# Patient Record
Sex: Male | Born: 1960 | Race: White | Hispanic: No | Marital: Single | Smoking: Current every day smoker
Health system: Southern US, Community
[De-identification: ages and names within clinical notes are randomized; demographics above are authoritative.]

## PROBLEM LIST (undated history)

## (undated) HISTORY — PX: HAND SURGERY: SHX662

---

## 2009-01-17 ENCOUNTER — Emergency Department (HOSPITAL_COMMUNITY): Admission: EM | Admit: 2009-01-17 | Discharge: 2009-01-17 | Payer: Self-pay | Admitting: Emergency Medicine

## 2011-09-04 ENCOUNTER — Emergency Department (HOSPITAL_COMMUNITY): Payer: Self-pay

## 2011-09-04 ENCOUNTER — Emergency Department (HOSPITAL_COMMUNITY)
Admission: EM | Admit: 2011-09-04 | Discharge: 2011-09-04 | Disposition: A | Discharge status: Home / Self Care | Payer: Self-pay | Attending: Emergency Medicine | Admitting: Emergency Medicine

## 2011-09-04 DIAGNOSIS — Z203 Contact with and (suspected) exposure to rabies: Secondary | ICD-10-CM | POA: Insufficient documentation

## 2011-09-04 DIAGNOSIS — W540XXA Bitten by dog, initial encounter: Secondary | ICD-10-CM | POA: Insufficient documentation

## 2011-09-04 DIAGNOSIS — S91009A Unspecified open wound, unspecified ankle, initial encounter: Secondary | ICD-10-CM | POA: Insufficient documentation

## 2011-09-04 DIAGNOSIS — Z23 Encounter for immunization: Secondary | ICD-10-CM | POA: Insufficient documentation

## 2011-09-04 DIAGNOSIS — S81009A Unspecified open wound, unspecified knee, initial encounter: Secondary | ICD-10-CM | POA: Insufficient documentation

## 2011-09-14 NOTE — Consult Note (Signed)
NAMED[place]TAE, ROUTE NO.:  [id number]  MEDICAL RECORD NO.:  [id number]  LOCATION:  WLED                         FACILITY:  [facility]  PHYSICIAN:  Jones Broom, MD    DATE OF BIRTH:  Jul 10, 1961  DATE OF CONSULTATION:  09/04/2011 DATE OF DISCHARGE:                                CONSULTATION   REASON FOR VISIT:  Orthopedic consultation regarding right knee laceration.  HISTORY OF PRESENT ILLNESS:  Mr. Hingle is a 50 year old, otherwise healthy gentleman who was bit by an unknown pit bull at 11 p.m. last evening on the right leg.  He came to the emergency department at the urging of his friends.  He was evaluated by the ED physician who was concerned about possible intraarticular involvement given the location of the wound and requested that I evaluate for an intraarticular knee involvement.  The patient denies any other injuries except for the two lacerations; one on the anterior right knee and one on the lateral right distal thigh.  Pain is minimal at this point.  PAST MEDICAL HISTORY:  None.  PAST SURGICAL HISTORY:  Prior knee surgery.  MEDICATIONS:  None.  DRUG ALLERGIES:  CODEINE.  FAMILY HISTORY:  Noncontributory.  SOCIAL HISTORY:  Endorses tobacco and alcohol use on a regular basis.  REVIEW OF SYSTEMS:  As above, otherwise negative.  PHYSICAL EXAM:  GENERAL:  Healthy, well-developed, well-nourished male in no acute distress, awake, alert and oriented x3. HEENT:  Extraocular motions intact.  No use of accessory or respiratory muscles for breathing.  No skin rash. EXTREMITIES:  Examination of the right knee demonstrates two isolated lacerations; one 3-cm transverse just inferior to the inferior pole of the patella.  The underlying retinaculum is exposed and it appears completely intact.  I did palpate this and by palpation, it was intact. There is no joint fluid apparent in the wound.  The edges of the wound are benign.  The distal and lateral  thigh wound measures approximately 4 cm longitudinal and has some underlying exposed muscle, but it is not in the region of the joint capsule. NEUROLOGICAL:  Distally, he is neurovascularly intact.  There is no pain with knee range of motion.  No knee effusion.  DIAGNOSTIC STUDIES:  Three views of the right knee reviewed in the emergency department demonstrate no evidence of fracture or subluxation.  PROCEDURE:  Under sterile conditions, I first infiltrated the lateral suprapatellar region with 3 cc 1% lidocaine with epinephrine and subsequently injected the knee with 120 cc of sterile normal saline through superolateral position with an 18-gauge needle.  This caused large knee effusion, but no evidence for any extravasation of the fluid through either wound with careful exploration.  The fluid was then removed from the knee and the knee was covered, small sterile dressing was applied.  IMPRESSION AND PLAN:  By inspection, palpation and injection, this does not appear to go into the knee joint and I think he can confidently be discharged home today without formal I and D.  He should have irrigation in the emergency department, loose closure, or no closure of the wounds with wound care, oral antibiotics and close followup in the next week or so.  He can follow up with me if desired.  My office number is [phone]. He can be weightbearing as tolerated.     Jones Broom, MD     JC/MEDQ  D:  09/04/2011  T:  09/04/2011  Job:  [id number]  Electronically Signed by Jones Broom  on 09/14/2011 11:34:26 PM

## 2012-05-03 ENCOUNTER — Encounter (HOSPITAL_COMMUNITY): Payer: Self-pay | Admitting: Emergency Medicine

## 2012-05-03 ENCOUNTER — Emergency Department (HOSPITAL_COMMUNITY)
Admission: EM | Admit: 2012-05-03 | Discharge: 2012-05-03 | Disposition: A | Discharge status: Home / Self Care | Payer: Self-pay | Attending: Emergency Medicine | Admitting: Emergency Medicine

## 2012-05-03 DIAGNOSIS — M62838 Other muscle spasm: Secondary | ICD-10-CM | POA: Insufficient documentation

## 2012-05-03 DIAGNOSIS — F17200 Nicotine dependence, unspecified, uncomplicated: Secondary | ICD-10-CM | POA: Insufficient documentation

## 2012-05-03 DIAGNOSIS — M545 Low back pain, unspecified: Secondary | ICD-10-CM | POA: Insufficient documentation

## 2012-05-03 DIAGNOSIS — M549 Dorsalgia, unspecified: Secondary | ICD-10-CM

## 2012-05-03 DIAGNOSIS — M79609 Pain in unspecified limb: Secondary | ICD-10-CM | POA: Insufficient documentation

## 2012-05-03 MED ORDER — KETOROLAC TROMETHAMINE 30 MG/ML IJ SOLN
30.0000 mg | Freq: Once | INTRAMUSCULAR | Status: AC
  Administered 2012-05-03: 30 mg via INTRAMUSCULAR
  Filled 2012-05-03: qty 1

## 2012-05-03 MED ORDER — HYDROCODONE-ACETAMINOPHEN 5-325 MG PO TABS
1.0000 | ORAL_TABLET | Freq: Once | ORAL | Status: AC
  Administered 2012-05-03: 1 via ORAL
  Filled 2012-05-03: qty 1

## 2012-05-03 MED ORDER — HYDROCODONE-ACETAMINOPHEN 5-500 MG PO TABS: 1.0000 | ORAL_TABLET | Freq: Four times a day (QID) | ORAL | Status: AC | PRN

## 2012-05-03 MED ORDER — IBUPROFEN 800 MG PO TABS: 800.0000 mg | ORAL_TABLET | Freq: Three times a day (TID) | ORAL | Status: AC

## 2012-05-03 MED ORDER — DIAZEPAM 5 MG PO TABS: ORAL_TABLET | ORAL | Status: AC

## 2012-05-03 MED ORDER — DIAZEPAM 5 MG PO TABS
10.0000 mg | ORAL_TABLET | Freq: Once | ORAL | Status: AC
  Administered 2012-05-03: 10 mg via ORAL
  Filled 2012-05-03 (×2): qty 1

## 2012-05-03 NOTE — ED Provider Notes (Signed)
History     CSN: [id number]  Arrival date & time 05/03/12  1744   First MD Initiated Contact with Patient 05/03/12 1747      Chief Complaint  Patient presents with  . Back Pain    (Consider location/radiation/quality/duration/timing/severity/associated sxs/prior treatment) HPI  Patient who states he has no known medical problems takes no medicines on a regular basis but states he has a history of "tweaking my back" presents to emergency department complaining of two-day history of right lower back pain with radiation of pain into his right lower extremity. Patient states that pain is similar to pain in the past when his back will "catch and tweak." Patient states that he twisted 2 days ago to turn and had sudden onset of pain in his right lower back. Patient states that over the last 2 days pain will come and go with radiation of pain into his right leg and foot. Patient states pain is aggravated by movement improved with rest. Patient states pain is similar to pain in the past. Patient denies any lower extremity numbness/tingling/weakness, loss of bowel or bladder function, saddle seat paresthesias, or difficulty ambulating however he states that ambulation is aggravated pain in lower back. He denies any abdominal pain or nausea/vomiting/diarrhea, dysuria or hematuria. Patient has taken nothing for pain prior to arrival.  No past medical history on file.  No past surgical history on file.  No family history on file.  History  Substance Use Topics  . Smoking status: Current Everyday Smoker -- 1.0 packs/day    Types: Cigarettes  . Smokeless tobacco: Not on file  . Alcohol Use: Yes     drinks Vodka 1 out of 3 days      Review of Systems  All other systems reviewed and are negative.    Allergies  Codeine  Home Medications   Current Outpatient Rx  Name Route Sig Dispense Refill  . CALCIUM CARBONATE ANTACID 500 MG PO CHEW Oral Chew 1 tablet by mouth daily.      BP 105/78   Pulse 109  Temp(Src) 98.1 F (36.7 C) (Oral)  Resp 16  SpO2 93%  Physical Exam  Nursing note and vitals reviewed. Constitutional: He is oriented to person, place, and time. He appears well-developed and well-nourished. No distress.  HENT:  Head: Normocephalic and atraumatic.  Eyes: Conjunctivae are normal.  Neck: Normal range of motion. Neck supple.  Cardiovascular: Normal rate, regular rhythm, normal heart sounds and intact distal pulses.  Exam reveals no gallop and no friction rub.   No murmur heard. Pulmonary/Chest: Effort normal and breath sounds normal. No respiratory distress. He has no wheezes. He has no rales. He exhibits no tenderness.  Abdominal: Soft. Bowel sounds are normal. He exhibits no distension and no mass. There is no tenderness. There is no rebound and no guarding.  Musculoskeletal: Normal range of motion. He exhibits tenderness. He exhibits no edema.       Tenderness to palpation of right lower lumbar paraspinal region with muscle spasticity but no crepitus, induration, or skin changes. 5 out of 5 strength of bilateral lower extremities no pain in right lower back with full range of motion of right lower [place]. Normal reflexes bilaterally normal sensation bilaterally  Neurological: He is alert and oriented to person, place, and time. He has normal reflexes.  Skin: Skin is warm and dry. No rash noted. He is not diaphoretic. No erythema.  Psychiatric: He has a normal mood and affect.    ED Course  Procedures (including critical care time)  IM toradol, PO valium and PO Norco  Labs Reviewed - No data to display No results found.   1. Back pain   2. Muscle spasm       MDM  No red flags or back pain with no signs or symptoms of central cord compression or cauda equina. Abdomen soft nontender. Patient has bilateral extremities are neurovascularly intact. With 5 out of 5 strength. Patient has muscle spasm of lower back. Spoke at length with patient about  changing or worsening symptoms that should prompt immediate return to emergency department otherwise following up with primary care provider and/or an orthopedic specialist for ongoing back pain. Patient voices understanding and is agreeable plan.        Jenness Corner, [place] 05/03/12 2025

## 2012-05-03 NOTE — Discharge Instructions (Signed)
Take ibuprofen as directed for inflammation and pain with hydrocodone-acetaminophen for breakthrough pain and valium for muscle relaxation but do not drive or operate machinery with hydrocodone or valium use. Ice to areas of soreness for the next few days and then may move to heat. Expect to be sore for the next few days and follow up with primary care physician for recheck of ongoing symptoms but return to ER for emergent changing or worsening of symptoms.    Back Pain, Adult Low back pain is very common. About 1 in 5 people have back pain.The cause of low back pain is rarely dangerous. The pain often gets better over time.About half of people with a sudden onset of back pain feel better in just 2 weeks. About 8 in 10 people feel better by 6 weeks.  CAUSES Some common causes of back pain include:  Strain of the muscles or ligaments supporting the spine.   Wear and tear (degeneration) of the spinal discs.   Arthritis.   Direct injury to the back.  DIAGNOSIS Most of the time, the direct cause of low back pain is not known.However, back pain can be treated effectively even when the exact cause of the pain is unknown.Answering your caregiver's questions about your overall health and symptoms is one of the most accurate ways to make sure the cause of your pain is not dangerous. If your caregiver needs more information, he or she may order lab work or imaging tests (X-rays or MRIs).However, even if imaging tests show changes in your back, this usually does not require surgery. HOME CARE INSTRUCTIONS For many people, back pain returns.Since low back pain is rarely dangerous, it is often a condition that people can learn to [facility] their own.   Remain active. It is stressful on the back to sit or stand in one place. Do not sit, drive, or stand in one place for more than 30 minutes at a time. Take short walks on level surfaces as soon as pain allows.Try to increase the length of time you walk  each day.   Do not stay in bed.Resting more than 1 or 2 days can delay your recovery.   Do not avoid exercise or work.Your body is made to move.It is not dangerous to be active, even though your back may hurt.Your back will likely heal faster if you return to being active before your pain is gone.   Pay attention to your body when you bend and lift. Many people have less discomfortwhen lifting if they bend their knees, keep the load close to their bodies,and avoid twisting. Often, the most comfortable positions are those that put less stress on your recovering back.   Find a comfortable position to sleep. Use a firm mattress and lie on your side with your knees slightly bent. If you lie on your back, put a pillow under your knees.   Only take over-the-counter or prescription medicines as directed by your caregiver. Over-the-counter medicines to reduce pain and inflammation are often the most helpful.Your caregiver may prescribe muscle relaxant drugs.These medicines help dull your pain so you can more quickly return to your normal activities and healthy exercise.   Put ice on the injured area.   Put ice in a plastic bag.   Place a towel between your skin and the bag.   Leave the ice on for 15 to 20 minutes, 3 to 4 times a day for the first 2 to 3 days. After that, ice and heat may  be alternated to reduce pain and spasms.   Ask your caregiver about trying back exercises and gentle massage. This may be of some benefit.   Avoid feeling anxious or stressed.Stress increases muscle tension and can worsen back pain.It is important to recognize when you are anxious or stressed and learn ways to manage it.Exercise is a great option.  SEEK MEDICAL CARE IF:  You have pain that is not relieved with rest or medicine.   You have pain that does not improve in 1 week.   You have new symptoms.   You are generally not feeling well.  SEEK IMMEDIATE MEDICAL CARE IF:   You have pain that  radiates from your back into your legs.   You develop new bowel or bladder control problems.   You have unusual weakness or numbness in your arms or legs.   You develop nausea or vomiting.   You develop abdominal pain.   You feel faint.  Document Released: 12/13/2005 Document Revised: 12/02/2011 Document Reviewed: 05/03/2011 [facility] Patient Information 2012 [place], [place].

## 2012-05-03 NOTE — ED Provider Notes (Signed)
Medical screening examination/treatment/procedure(s) were performed by non-physician practitioner and as supervising physician I was immediately available for consultation/collaboration.   Donel Osowski M Lawren Sexson, MD 05/03/12 2333 

## 2012-05-03 NOTE — ED Notes (Signed)
Reviewed Rx for Vicodin, Valium and Ibuprofen

## 2012-05-03 NOTE — ED Notes (Signed)
Turned and had sudden back pain. Started in right foot and leg last pm, now in back

## 2012-06-16 ENCOUNTER — Encounter (HOSPITAL_COMMUNITY): Payer: Self-pay | Admitting: Emergency Medicine

## 2012-06-16 ENCOUNTER — Emergency Department (HOSPITAL_COMMUNITY)
Admission: EM | Admit: 2012-06-16 | Discharge: 2012-06-16 | Disposition: A | Discharge status: Home / Self Care | Payer: Self-pay | Attending: Emergency Medicine | Admitting: Emergency Medicine

## 2012-06-16 DIAGNOSIS — M545 Low back pain, unspecified: Secondary | ICD-10-CM | POA: Insufficient documentation

## 2012-06-16 DIAGNOSIS — M5432 Sciatica, left side: Secondary | ICD-10-CM

## 2012-06-16 DIAGNOSIS — M5430 Sciatica, unspecified side: Secondary | ICD-10-CM | POA: Insufficient documentation

## 2012-06-16 DIAGNOSIS — F17200 Nicotine dependence, unspecified, uncomplicated: Secondary | ICD-10-CM | POA: Insufficient documentation

## 2012-06-16 MED ORDER — TRAMADOL HCL 50 MG PO TABS: 50.0000 mg | ORAL_TABLET | Freq: Four times a day (QID) | ORAL | Status: AC | PRN

## 2012-06-16 MED ORDER — DIAZEPAM 5 MG PO TABS
5.0000 mg | ORAL_TABLET | Freq: Once | ORAL | Status: AC
  Administered 2012-06-16: 5 mg via ORAL
  Filled 2012-06-16: qty 1

## 2012-06-16 MED ORDER — PREDNISONE 20 MG PO TABS: 40.0000 mg | ORAL_TABLET | Freq: Every day | ORAL | Status: AC

## 2012-06-16 MED ORDER — PREDNISONE 20 MG PO TABS
60.0000 mg | ORAL_TABLET | Freq: Once | ORAL | Status: AC
  Administered 2012-06-16: 60 mg via ORAL
  Filled 2012-06-16: qty 3

## 2012-06-16 MED ORDER — HYDROMORPHONE HCL PF 1 MG/ML IJ SOLN
1.0000 mg | Freq: Once | INTRAMUSCULAR | Status: AC
  Administered 2012-06-16: 1 mg via INTRAMUSCULAR
  Filled 2012-06-16: qty 1

## 2012-06-16 MED ORDER — DIAZEPAM 5 MG PO TABS: 5.0000 mg | ORAL_TABLET | Freq: Once | ORAL | Status: AC

## 2012-06-16 NOTE — ED Notes (Signed)
Pt states he has low back pain that started about 8pm last night  Pt states he has chronic back problems and sometimes he moves wrong and it flares up  Pt denies injury

## 2012-06-16 NOTE — ED Provider Notes (Signed)
Medical screening examination/treatment/procedure(s) were performed by non-physician practitioner and as supervising physician I was immediately available for consultation/collaboration.    Lexine Jaspers D Marlet Korte, MD 06/16/12 1828 

## 2012-06-16 NOTE — ED Provider Notes (Signed)
History     CSN: [id number]  Arrival date & time 06/16/12  0305   First MD Initiated Contact with Patient 06/16/12 0319      Chief Complaint  Patient presents with  . 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 male presenting with back pain. The history is provided by the patient.  Back Pain  This is a recurrent problem. The current episode started 3 to 5 hours ago. The problem occurs constantly. The problem has not changed since onset.The pain is associated with no known injury. The pain is present in the lumbar spine. The quality of the pain is described as shooting. The pain radiates to the left foot. The pain is at a severity of 8/10. The pain is moderate. The symptoms are aggravated by certain positions. Pertinent negatives include no fever, no numbness, no dysuria and no weakness.    History reviewed. No pertinent past medical history.  Past Surgical History  Procedure Date  . Hand surgery     History reviewed. No pertinent family history.  History  Substance Use Topics  . Smoking status: Current Everyday Smoker -- 1.0 packs/day    Types: Cigarettes  . Smokeless tobacco: Not on file  . Alcohol Use: Yes     drinks Vodka 1 out of 3 days      Review of Systems  Constitutional: Negative for fever.  Respiratory: Negative for shortness of breath.   Genitourinary: Negative for dysuria and flank pain.  Musculoskeletal: Positive for back pain. Negative for joint swelling.  Neurological: Negative for dizziness, weakness and numbness.    Allergies  Codeine  Home Medications   Current Outpatient Rx  Name Route Sig Dispense Refill  . B COMPLEX-C PO TABS  Oral Take 1 tablet by mouth daily.    Marland Kitchen DIPHENHYDRAMINE-APAP (SLEEP) 25-500 MG PO TABS Oral Take 1 tablet by mouth at bedtime as needed.    . ADULT MULTIVITAMIN W/MINERALS CH Oral Take 1 tablet by mouth daily.    Marland Kitchen DIAZEPAM 5 MG PO TABS Oral Take 1 tablet (5 mg total) by mouth once. 20 tablet 0  . PREDNISONE 20 MG PO TABS Oral Take 2 tablets (40 mg total) by mouth daily. 10 tablet 0  . TRAMADOL HCL 50 MG PO TABS Oral Take 1 tablet (50 mg total) by mouth every 6 (six) hours as needed for pain. 15 tablet 0    BP 114/85  Pulse 99  Temp 98.7 F (37.1 C)  Resp 20  Wt 145 lb (65.772 kg)  SpO2 94%  Physical Exam  Constitutional: He appears well-developed and well-nourished.  HENT:  Head: Normocephalic.  Eyes: Pupils are equal, round, and reactive to light.  Neck: Normal range of motion.  Cardiovascular: Normal rate.   Pulmonary/Chest: Effort normal.  Musculoskeletal: Normal range of motion. He exhibits no edema.       Lumbar back: He exhibits pain. He exhibits no bony tenderness and no swelling.       Back:  Skin: Skin is warm and dry. No rash noted.    ED Course  Procedures (including critical care time)  Labs Reviewed - No data to display No results found.   1.  Sciatica of left side     Patient more comfortable able to fall asleep   MDM   Will treat for excerebration of LBP with valium, prednisone and 1mg  Dilaudid IM        Arman Filter, NP 06/16/12 0458  Arman Filter, NP 06/16/12 [phone]

## 2014-08-13 ENCOUNTER — Encounter (HOSPITAL_COMMUNITY): Payer: Self-pay | Admitting: Emergency Medicine

## 2014-08-13 ENCOUNTER — Emergency Department (HOSPITAL_COMMUNITY)
Admission: EM | Admit: 2014-08-13 | Discharge: 2014-08-13 | Disposition: A | Discharge status: Home / Self Care | Payer: Self-pay | Attending: Emergency Medicine | Admitting: Emergency Medicine

## 2014-08-13 DIAGNOSIS — Z79899 Other long term (current) drug therapy: Secondary | ICD-10-CM | POA: Insufficient documentation

## 2014-08-13 DIAGNOSIS — R21 Rash and other nonspecific skin eruption: Secondary | ICD-10-CM | POA: Insufficient documentation

## 2014-08-13 DIAGNOSIS — F17200 Nicotine dependence, unspecified, uncomplicated: Secondary | ICD-10-CM | POA: Insufficient documentation

## 2014-08-13 MED ORDER — HYDROXYZINE HCL 25 MG PO TABS: 25.0000 mg | ORAL_TABLET | ORAL | Status: AC | PRN

## 2014-08-13 MED ORDER — PERMETHRIN 5 % EX CREA: 1.0000 "application " | TOPICAL_CREAM | Freq: Once | CUTANEOUS | Status: AC

## 2014-08-13 MED ORDER — HYDROCODONE-ACETAMINOPHEN 5-325 MG PO TABS
1.0000 | ORAL_TABLET | Freq: Once | ORAL | Status: AC
  Administered 2014-08-13: 1 via ORAL
  Filled 2014-08-13: qty 1

## 2014-08-13 MED ORDER — DIPHENHYDRAMINE HCL 50 MG/ML IJ SOLN
25.0000 mg | Freq: Once | INTRAMUSCULAR | Status: AC
  Administered 2014-08-13: 25 mg via INTRAVENOUS
  Filled 2014-08-13: qty 1

## 2014-08-13 MED ORDER — PREDNISONE 50 MG PO TABS: 50.0000 mg | ORAL_TABLET | Freq: Every day | ORAL | Status: AC

## 2014-08-13 MED ORDER — DEXAMETHASONE SODIUM PHOSPHATE 10 MG/ML IJ SOLN
10.0000 mg | Freq: Once | INTRAMUSCULAR | Status: AC
  Administered 2014-08-13: 10 mg via INTRAMUSCULAR
  Filled 2014-08-13: qty 1

## 2014-08-13 NOTE — ED Notes (Signed)
Pt states he has not been able to sleep for the past 2 nights because of the rash  Pt states he has had this rash for the past 2 to 3 weeks  Pt states it feels like he is being stuck by needles  Pt states he has a knot behind his right knee he wants looked at while he is here

## 2014-08-13 NOTE — ED Provider Notes (Signed)
CSN: [id number]     Arrival date & time 08/13/14  1940 History   None    This chart was scribed for non-physician practitioner,  working with Elwin MochaBlair Walden, MD by Arlan OrganAshley Leger, ED Scribe. This patient was seen in room WTR8/WTR8 and the patient's care was started at 9:01 PM.   Chief Complaint  Patient presents with  . Rash   The history is provided by the patient. No language interpreter was used.    HPI Comments: Cristal GenerousMichael Bark is a 53 y.o. male who presents to the Emergency Department complaining of generalized pruritis rash to the whole body onset 06/29/2014 that has progressively worsened since time of onset. Pt states he initially noted the rash to the groin but says symptoms have spread to majority of the body. He states he has not been able to sleep recently secondary to itching. He denies any aggravating factors. However, states itching is alleviated with warm/hot water. He has also tried a friends "nerve pill" without any improvement for symptoms. At this time he denies any fever or chills. Pt with no known allergies to medications. No other concerns this visit.  History reviewed. No pertinent past medical history. Past Surgical History  Procedure Laterality Date  . Hand surgery     History reviewed. No pertinent family history. History  Substance Use Topics  . Smoking status: Current Every Day Smoker -- 1.00 packs/day    Types: Cigarettes  . Smokeless tobacco: Not on file  . Alcohol Use: Yes     Comment: drinks Vodka 1 out of 3 days    Review of Systems  Constitutional: Negative for fever and chills.  Skin: Positive for rash.      Allergies  Codeine  Home Medications   Prior to Admission medications   Medication Sig Start Date End Date Taking? Authorizing Provider  B Complex-C (B-COMPLEX WITH VITAMIN C) tablet Take 1 tablet by mouth daily.    Historical Provider, MD  diphenhydramine-acetaminophen (TYLENOL PM) 25-500 MG TABS Take 1 tablet by mouth at bedtime as  needed.    Historical Provider, MD  Multiple Vitamin (MULTIVITAMIN WITH MINERALS) TABS Take 1 tablet by mouth daily.    Historical Provider, MD   Triage Vitals: BP 111/77  Pulse 101  Temp(Src) 98.4 F (36.9 C) (Oral)  Resp 20  SpO2 96%   Physical Exam  Nursing note and vitals reviewed. Constitutional: He is oriented to person, place, and time. He appears well-developed and well-nourished.  HENT:  Head: Normocephalic and atraumatic.  Eyes: EOM are normal.  Neck: Normal range of motion.  Pulmonary/Chest: Effort normal.  Musculoskeletal: Normal range of motion.  Neurological: He is alert and oriented to person, place, and time.  Skin: Rash noted.  Diffuse macular rash to arms, chest, back, groin, legs, and buttocks  Psychiatric: He has a normal mood and affect.    ED Course  Procedures (including critical care time)  DIAGNOSTIC STUDIES: Oxygen Saturation is 96% on RA, adequate by my interpretation.    COORDINATION OF CARE: 9:33 PM- Will give Benadryl and Decadron in ED. Discussed treatment plan with pt at bedside and pt agreed to plan.     The patient will be treated for an allergic reaction versus scabies type rash. Told to return here as needed.   I personally performed the services described in this documentation, which was scribed in my presence. The recorded information has been reviewed and is accurate.    Carlyle DollyChristopher W Jarmal Lewelling, PA-C 08/13/14 2145

## 2014-08-13 NOTE — ED Notes (Signed)
Patient is alert and oriented x3.  He was given DC instructions and follow up visit instructions.  Patient gave verbal understanding.  He was DC ambulatory under his own power to home.  V/S stable.  He was not showing any signs of distress on DC 

## 2014-08-13 NOTE — Discharge Instructions (Signed)
Return here as needed. Follow up with the doctor provided for your knee.

## 2014-08-14 ENCOUNTER — Encounter (HOSPITAL_COMMUNITY): Payer: Self-pay | Admitting: Emergency Medicine

## 2014-08-14 ENCOUNTER — Emergency Department (HOSPITAL_COMMUNITY)
Admission: EM | Admit: 2014-08-14 | Discharge: 2014-08-14 | Disposition: A | Discharge status: Home / Self Care | Payer: Self-pay | Attending: Emergency Medicine | Admitting: Emergency Medicine

## 2014-08-14 DIAGNOSIS — F17200 Nicotine dependence, unspecified, uncomplicated: Secondary | ICD-10-CM | POA: Insufficient documentation

## 2014-08-14 DIAGNOSIS — Z79899 Other long term (current) drug therapy: Secondary | ICD-10-CM | POA: Insufficient documentation

## 2014-08-14 DIAGNOSIS — R21 Rash and other nonspecific skin eruption: Secondary | ICD-10-CM | POA: Insufficient documentation

## 2014-08-14 MED ORDER — DIPHENHYDRAMINE HCL 25 MG PO CAPS
ORAL_CAPSULE | ORAL | Status: AC
  Filled 2014-08-14: qty 2

## 2014-08-14 MED ORDER — DIPHENHYDRAMINE HCL 25 MG PO CAPS
50.0000 mg | ORAL_CAPSULE | Freq: Once | ORAL | Status: AC
  Administered 2014-08-14: 50 mg via ORAL

## 2014-08-14 NOTE — ED Notes (Signed)
Pt states he wants to be checked for diabetes while he is here

## 2014-08-14 NOTE — ED Notes (Signed)
Pt has a rash and was seen earlier tonight for same  Pt states he received a shot in both arms and states it has not helped

## 2014-08-14 NOTE — ED Provider Notes (Signed)
Medical screening examination/treatment/procedure(s) were performed by non-physician practitioner and as supervising physician I was immediately available for consultation/collaboration.   EKG Interpretation None        Lenora Gomes, MD 08/14/14 0011 

## 2014-08-14 NOTE — Discharge Instructions (Signed)
Fill the medications that were previously prescribed. If your rash persists followup with a dermatologist.  Rash A rash is a change in the color or texture of your skin. There are many different types of rashes. You may have other problems that accompany your rash. CAUSES   Infections.  Allergic reactions. This can include allergies to pets or foods.  Certain medicines.  Exposure to certain chemicals, soaps, or cosmetics.  Heat.  Exposure to poisonous plants.  Tumors, both cancerous and noncancerous. SYMPTOMS   Redness.  Scaly skin.  Itchy skin.  Dry or cracked skin.  Bumps.  Blisters.  Pain. DIAGNOSIS  Your caregiver may do a physical exam to determine what type of rash you have. A skin sample (biopsy) may be taken and examined under a microscope. TREATMENT  Treatment depends on the type of rash you have. Your caregiver may prescribe certain medicines. For serious conditions, you may need to see a skin doctor (dermatologist). HOME CARE INSTRUCTIONS   Avoid the substance that caused your rash.  Do not scratch your rash. This can cause infection.  You may take cool baths to help stop itching.  Only take over-the-counter or prescription medicines as directed by your caregiver.  Keep all follow-up appointments as directed by your caregiver. SEEK IMMEDIATE MEDICAL CARE IF:  You have increasing pain, swelling, or redness.  You have a fever.  You have new or severe symptoms.  You have body aches, diarrhea, or vomiting.  Your rash is not better after 3 days. MAKE SURE YOU:  Understand these instructions.  Will watch your condition.  Will get help right away if you are not doing well or get worse. Document Released: 12/03/2002 Document Revised: 03/06/2012 Document Reviewed: 09/27/2011 [facility] Patient Information 2015 [place], [place]. This information is not intended to replace advice given to you by your health care provider. Make sure you discuss any  questions you have with your health care provider.
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
## 2015-07-22 ENCOUNTER — Encounter (HOSPITAL_COMMUNITY): Payer: Self-pay | Admitting: *Deleted

## 2015-07-22 ENCOUNTER — Emergency Department (HOSPITAL_COMMUNITY): Payer: Self-pay

## 2015-07-22 ENCOUNTER — Emergency Department (HOSPITAL_COMMUNITY)
Admission: EM | Admit: 2015-07-22 | Discharge: 2015-07-22 | Disposition: A | Discharge status: Home / Self Care | Payer: Self-pay | Attending: Emergency Medicine | Admitting: Emergency Medicine

## 2015-07-22 DIAGNOSIS — S022XXA Fracture of nasal bones, initial encounter for closed fracture: Secondary | ICD-10-CM | POA: Insufficient documentation

## 2015-07-22 DIAGNOSIS — Y998 Other external cause status: Secondary | ICD-10-CM | POA: Insufficient documentation

## 2015-07-22 DIAGNOSIS — S0121XA Laceration without foreign body of nose, initial encounter: Secondary | ICD-10-CM | POA: Insufficient documentation

## 2015-07-22 DIAGNOSIS — Z23 Encounter for immunization: Secondary | ICD-10-CM | POA: Insufficient documentation

## 2015-07-22 DIAGNOSIS — Y9389 Activity, other specified: Secondary | ICD-10-CM | POA: Insufficient documentation

## 2015-07-22 DIAGNOSIS — Z7952 Long term (current) use of systemic steroids: Secondary | ICD-10-CM | POA: Insufficient documentation

## 2015-07-22 DIAGNOSIS — Z79899 Other long term (current) drug therapy: Secondary | ICD-10-CM | POA: Insufficient documentation

## 2015-07-22 DIAGNOSIS — Y9289 Other specified places as the place of occurrence of the external cause: Secondary | ICD-10-CM | POA: Insufficient documentation

## 2015-07-22 DIAGNOSIS — S0101XA Laceration without foreign body of scalp, initial encounter: Secondary | ICD-10-CM | POA: Insufficient documentation

## 2015-07-22 DIAGNOSIS — S0990XA Unspecified injury of head, initial encounter: Secondary | ICD-10-CM | POA: Insufficient documentation

## 2015-07-22 DIAGNOSIS — S022XXB Fracture of nasal bones, initial encounter for open fracture: Secondary | ICD-10-CM

## 2015-07-22 DIAGNOSIS — Z720 Tobacco use: Secondary | ICD-10-CM | POA: Insufficient documentation

## 2015-07-22 MED ORDER — TETANUS-DIPHTH-ACELL PERTUSSIS 5-2.5-18.5 LF-MCG/0.5 IM SUSP
0.5000 mL | Freq: Once | INTRAMUSCULAR | Status: AC
  Administered 2015-07-22: 0.5 mL via INTRAMUSCULAR
  Filled 2015-07-22: qty 0.5

## 2015-07-22 MED ORDER — ONDANSETRON 4 MG PO TBDP: 8.0000 mg | ORAL_TABLET | Freq: Once | ORAL | Status: DC

## 2015-07-22 MED ORDER — LIDOCAINE-EPINEPHRINE (PF) 2 %-1:200000 IJ SOLN
20.0000 mL | Freq: Once | INTRAMUSCULAR | Status: AC
  Administered 2015-07-22: 20 mL via INTRADERMAL
  Filled 2015-07-22: qty 20

## 2015-07-22 MED ORDER — HYDROCODONE-ACETAMINOPHEN 5-325 MG PO TABS: 1.0000 | ORAL_TABLET | Freq: Four times a day (QID) | ORAL | Status: DC | PRN

## 2015-07-22 MED ORDER — OXYCODONE-ACETAMINOPHEN 5-325 MG PO TABS: 1.0000 | ORAL_TABLET | Freq: Once | ORAL | Status: DC

## 2015-07-22 MED ORDER — OXYCODONE-ACETAMINOPHEN 5-325 MG PO TABS
1.0000 | ORAL_TABLET | Freq: Once | ORAL | Status: AC
  Administered 2015-07-22: 1 via ORAL
  Filled 2015-07-22: qty 1
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
## 2015-07-22 NOTE — ED Notes (Signed)
3-4 inch lacerartion to the back of his head.  Minimal bleeding.  laceratiion cleANED WITH SOAP AND WATER.  C/O EXTRME PAIN

## 2015-07-22 NOTE — ED Notes (Signed)
Pt asking for ice water  given

## 2015-07-22 NOTE — ED Notes (Signed)
Pt was instructed by Evangeline Gula not to eat or drink anything until he saw the doctor. Pt was seen in waiting room drinking out of the water faucet in the peds waiting room bathroom.
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
## 2015-07-22 NOTE — Discharge Instructions (Signed)
Keep wounds clean and dry, remove sutures on the nose in 7 days, remove scalp sutures in 10 days. Please follow-up in order to do that. Apply bacitracin topically to the cuts twice a day. Take pain medications as needed as prescribed. Follow-up with ear nose throat doctor if any problems with your nasal fracture.    Head Injury You have received a head injury. It does not appear serious at this time. Headaches and vomiting are common following head injury. It should be easy to awaken from sleeping. Sometimes it is necessary for you to stay in the emergency department for a while for observation. Sometimes admission to the hospital may be needed. After injuries such as yours, most problems occur within the first 24 hours, but side effects may occur up to 7-10 days after the injury. It is important for you to carefully monitor your condition and contact your health care provider or seek immediate medical care if there is a change in your condition. WHAT ARE THE TYPES OF HEAD INJURIES? Head injuries can be as minor as a bump. Some head injuries can be more severe. More severe head injuries include:  A jarring injury to the brain (concussion).  A bruise of the brain (contusion). This mean there is bleeding in the brain that can cause swelling.  A cracked skull (skull fracture).  Bleeding in the brain that collects, clots, and forms a bump (hematoma). WHAT CAUSES A HEAD INJURY? A serious head injury is most likely to happen to someone who is in a car wreck and is not wearing a seat belt. Other causes of major head injuries include bicycle or motorcycle accidents, sports injuries, and falls. HOW ARE HEAD INJURIES DIAGNOSED? A complete history of the event leading to the injury and your current symptoms will be helpful in diagnosing head injuries. Many times, pictures of the brain, such as CT or MRI are needed to see the extent of the injury. Often, an overnight hospital stay is necessary for observation.   WHEN SHOULD I SEEK IMMEDIATE MEDICAL CARE?  You should get help right away if:  You have confusion or drowsiness.  You feel sick to your stomach (nauseous) or have continued, forceful vomiting.  You have dizziness or unsteadiness that is getting worse.  You have severe, continued headaches not relieved by medicine. Only take over-the-counter or prescription medicines for pain, fever, or discomfort as directed by your health care provider.  You do not have normal function of the arms or legs or are unable to walk.  You notice changes in the black spots in the center of the colored part of your eye (pupil).  You have a clear or bloody fluid coming from your nose or ears.  You have a loss of vision. During the next 24 hours after the injury, you must stay with someone who can watch you for the warning signs. This person should contact local emergency services (911 in the [place] if you have seizures, you become unconscious, or you are unable to wake up. HOW CAN I PREVENT A HEAD INJURY IN THE FUTURE? The most important factor for preventing major head injuries is avoiding motor vehicle accidents. To minimize the potential for damage to your head, it is crucial to wear seat belts while riding in motor vehicles. Wearing helmets while bike riding and playing collision sports (like football) is also helpful. Also, avoiding dangerous activities around the house will further help reduce your risk of head injury.  WHEN CAN I RETURN TO  NORMAL ACTIVITIES AND ATHLETICS? You should be reevaluated by your health care provider before returning to these activities. If you have any of the following symptoms, you should not return to activities or contact sports until 1 week after the symptoms have stopped:  Persistent headache.  Dizziness or vertigo.  Poor attention and concentration.  Confusion.  Memory problems.  Nausea or vomiting.  Fatigue or tire easily.  Irritability.  Intolerant of  bright lights or loud noises.  Anxiety or depression.  Disturbed sleep. MAKE SURE YOU:   Understand these instructions.  Will watch your condition.  Will get help right away if you are not doing well or get worse. Document Released: 12/13/2005 Document Revised: 12/18/2013 Document Reviewed: 08/20/2013 [facility] Patient Information 2015 [place], [place]. This information is not intended to replace advice given to you by your health care provider. Make sure you discuss any questions you have with your health care provider.  Staple Care and Removal Your caregiver has used staples today to repair your wound. Staples are used to help a wound heal faster by holding the edges of the wound together. The staples can be removed when the wound has healed well enough to stay together after the staples are removed. A dressing (wound covering), depending on the location of the wound, may have been applied. This may be changed once per day or as instructed. If the dressing sticks, it may be soaked off with soapy water or hydrogen peroxide. Only take over-the-counter or prescription medicines for pain, discomfort, or fever as directed by your caregiver.  If you did not receive a tetanus shot today because you did not recall when your last one was given, check with your caregiver when you have your staples removed to determine if one is needed. Return to your caregiver's office in 1 week or as suggested to have your staples removed. SEEK IMMEDIATE MEDICAL CARE IF:   You have redness, swelling, or increasing pain in the wound.  You have pus coming from the wound.  You have a fever.  You notice a bad smell coming from the wound or dressing.  Your wound edges break open after staples have been removed. Document Released: 09/07/2001 Document Revised: 03/06/2012 Document Reviewed: 09/22/2005 [facility] Patient Information 2015 [place], [place]. This information is not intended to replace advice given to you by  your health care provider. Make sure you discuss any questions you have with your health care provider.  Nasal Fracture A nasal fracture is a break or crack in the bones of the nose. A minor break usually heals in a month. You often will receive black eyes from a nasal fracture. This is not a cause for concern. The black eyes will go away over 1 to 2 weeks.  DIAGNOSIS  Your caregiver may want to examine you if you are concerned about a fracture of the nose. X-rays of the nose may not show a nasal fracture even when one is present. Sometimes your caregiver must wait 1 to 5 days after the injury to re-check the nose for alignment and to take additional X-rays. Sometimes the caregiver must wait until the swelling has gone down. TREATMENT Minor fractures that have caused no deformity often do not require treatment. More serious fractures where bones are displaced may require surgery. This will take place after the swelling is gone. Surgery will stabilize and align the fracture. HOME CARE INSTRUCTIONS   Put ice on the injured area.  Put ice in a plastic bag.  Place a  towel between your skin and the bag.  Leave the ice on for 15-20 minutes, 03-04 times a day.  Take medications as directed by your caregiver.  Only take over-the-counter or prescription medicines for pain, discomfort, or fever as directed by your caregiver.  If your nose starts bleeding, squeeze the soft parts of the nose against the center wall while you are sitting in an upright position for 10 minutes.  Contact sports should be avoided for at least 3 to 4 weeks or as directed by your caregiver. SEEK MEDICAL CARE IF:  Your pain increases or becomes severe.  You continue to have nosebleeds.  The shape of your nose does not return to normal within 5 days.  You have pus draining from the nose. SEEK IMMEDIATE MEDICAL CARE IF:   You have bleeding from your nose that does not stop after 20 minutes of pinching the nostrils  closed and keeping ice on the nose.  You have clear fluid draining from your nose.  You notice a grape-like swelling on the dividing wall between the nostrils (septum). This is a collection of blood (hematoma) that must be drained to help prevent infection.  You have difficulty moving your eyes.  You have recurrent vomiting. Document Released: 12/10/2000 Document Revised: 03/06/2012 Document Reviewed: 03/29/2011 [facility] Patient Information 2015 [place], [place]. This information is not intended to replace advice given to you by your health care provider. Make sure you discuss any questions you have with your health care provider.

## 2015-07-31 ENCOUNTER — Ambulatory Visit (INDEPENDENT_AMBULATORY_CARE_PROVIDER_SITE_OTHER): Payer: Self-pay | Admitting: Otolaryngology

## 2015-07-31 DIAGNOSIS — S022XXA Fracture of nasal bones, initial encounter for closed fracture: Secondary | ICD-10-CM
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
## 2020-08-14 ENCOUNTER — Other Ambulatory Visit: Payer: Self-pay

## 2020-08-14 ENCOUNTER — Emergency Department (HOSPITAL_COMMUNITY)
Admission: EM | Admit: 2020-08-14 | Discharge: 2020-08-14 | Disposition: A | Discharge status: Home / Self Care | Payer: Self-pay | Attending: Emergency Medicine | Admitting: Emergency Medicine

## 2020-08-14 ENCOUNTER — Encounter (HOSPITAL_COMMUNITY): Payer: Self-pay | Admitting: Emergency Medicine

## 2020-08-14 DIAGNOSIS — Y929 Unspecified place or not applicable: Secondary | ICD-10-CM | POA: Insufficient documentation

## 2020-08-14 DIAGNOSIS — Z23 Encounter for immunization: Secondary | ICD-10-CM | POA: Insufficient documentation

## 2020-08-14 DIAGNOSIS — X58XXXA Exposure to other specified factors, initial encounter: Secondary | ICD-10-CM | POA: Insufficient documentation

## 2020-08-14 DIAGNOSIS — W57XXXA Bitten or stung by nonvenomous insect and other nonvenomous arthropods, initial encounter: Secondary | ICD-10-CM

## 2020-08-14 DIAGNOSIS — Y939 Activity, unspecified: Secondary | ICD-10-CM | POA: Insufficient documentation

## 2020-08-14 DIAGNOSIS — L03116 Cellulitis of left lower limb: Secondary | ICD-10-CM | POA: Insufficient documentation

## 2020-08-14 DIAGNOSIS — F17210 Nicotine dependence, cigarettes, uncomplicated: Secondary | ICD-10-CM | POA: Insufficient documentation

## 2020-08-14 DIAGNOSIS — Y999 Unspecified external cause status: Secondary | ICD-10-CM | POA: Insufficient documentation

## 2020-08-14 DIAGNOSIS — S80862A Insect bite (nonvenomous), left lower leg, initial encounter: Secondary | ICD-10-CM

## 2020-08-14 MED ORDER — ACETAMINOPHEN 325 MG PO TABS
650.0000 mg | ORAL_TABLET | Freq: Once | ORAL | Status: AC
  Administered 2020-08-14: 650 mg via ORAL
  Filled 2020-08-14: qty 2

## 2020-08-14 MED ORDER — DOXYCYCLINE HYCLATE 100 MG PO CAPS: 100.0000 mg | ORAL_CAPSULE | Freq: Two times a day (BID) | ORAL | 0 refills | Status: AC

## 2020-08-14 MED ORDER — DOXYCYCLINE HYCLATE 100 MG PO TABS
100.0000 mg | ORAL_TABLET | Freq: Once | ORAL | Status: AC
  Administered 2020-08-14: 100 mg via ORAL
  Filled 2020-08-14: qty 1

## 2020-08-14 MED ORDER — TETANUS-DIPHTH-ACELL PERTUSSIS 5-2.5-18.5 LF-MCG/0.5 IM SUSP
0.5000 mL | Freq: Once | INTRAMUSCULAR | Status: AC
  Administered 2020-08-14: 0.5 mL via INTRAMUSCULAR
  Filled 2020-08-14: qty 0.5

## 2020-08-14 MED ORDER — HYDROCODONE-ACETAMINOPHEN 5-325 MG PO TABS: 1.0000 | ORAL_TABLET | Freq: Four times a day (QID) | ORAL | 0 refills | Status: AC | PRN
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
## 2020-08-14 NOTE — ED Provider Notes (Signed)
[facility] Provider Note   CSN: [id number] Arrival date & time: 08/14/20  1343     History Chief Complaint  Patient presents with  . Insect Bite    Hayden Sanders is a 59 y.o. male who presents for evaluation of left lower extremity pain, redness and wound after an insect bite that occurred about a 1 week ago. Patient states that he never visualized anything biting him but states that he then noticed what appeared to be an insect bite noted to the anterior aspect of his left lower shin. He states that he has been treating it with hydrogen peroxide at home but states that it has continued to cause pain and has been more red, more painful and getting slightly larger.  He has not noted any fevers.  He states it has not drained anything.  He has not any nausea/vomiting.  He does not note his last Tdap was.  The history is provided by the patient.       History reviewed. No pertinent past medical history.  There are no problems to display for this patient.   Past Surgical History:  Procedure Laterality Date  . HAND SURGERY         No family history on file.  Social History   Tobacco Use  . Smoking status: Current Every Day Smoker    Packs/day: 1.00    Types: Cigarettes  Substance Use Topics  . Alcohol use: Yes    Comment: drinks Vodka 1 out of 3 days  . Drug use: Yes    Types: Marijuana    Home Medications Prior to Admission medications   Medication Sig Start Date End Date Taking? Authorizing Provider  doxycycline (VIBRAMYCIN) 100 MG capsule Take 1 capsule (100 mg total) by mouth 2 (two) times daily for 7 days. 08/14/20 08/21/20  Maxwell Caul, PA-C  HYDROcodone-acetaminophen (NORCO/VICODIN) 5-325 MG tablet Take 1-2 tablets by mouth every 6 (six) hours as needed. 08/14/20   Maxwell Caul, PA-C  hydrOXYzine (ATARAX/VISTARIL) 25 MG tablet Take 1 tablet (25 mg total) by mouth every 4 (four) hours as needed for itching. Patient  not taking: Reported on 07/22/2015 08/13/14   Charlestine Night, PA-C  permethrin (ELIMITE) 5 % cream Apply 1 application topically once. Repeat in 2 weeks. Apply head to toe and rinse off after 8 hours Patient not taking: Reported on 07/22/2015 08/13/14   Lawyer, Cristal Deer, PA-C  predniSONE (DELTASONE) 50 MG tablet Take 1 tablet (50 mg total) by mouth daily. Patient not taking: Reported on 07/22/2015 08/13/14   Charlestine Night, PA-C    Allergies    Codeine  Review of Systems   Review of Systems  Constitutional: Negative for fever.  Gastrointestinal: Negative for abdominal pain, nausea and vomiting.  Skin: Positive for color change and wound.  All other systems reviewed and are negative.   Physical Exam Updated Vital Signs BP (!) 154/110 (BP Location: Left Arm)   Pulse (!) 110   Temp 98.3 F (36.8 C) (Oral)   Resp 18   SpO2 96%   Physical Exam Vitals and nursing note reviewed.  Constitutional:      Appearance: Normal appearance. He is well-developed.     Comments: Sitting comfortably on examination table  HENT:     Head: Normocephalic and atraumatic.  Eyes:     General: Lids are normal.     Conjunctiva/sclera: Conjunctivae normal.     Pupils: Pupils are equal, round, and reactive to light.  Cardiovascular:  Rate and Rhythm: Normal rate and regular rhythm.     Pulses: Normal pulses.          Dorsalis pedis pulses are 2+ on the right side and 2+ on the left side.     Heart sounds: Normal heart sounds. No murmur heard.  No friction rub. No gallop.   Pulmonary:     Effort: Pulmonary effort is normal.     Breath sounds: Normal breath sounds.  Abdominal:     Palpations: Abdomen is soft. Abdomen is not rigid.     Tenderness: There is no abdominal tenderness. There is no guarding.  Musculoskeletal:        General: Normal range of motion.     Cervical back: Full passive range of motion without pain.     Comments: FROM of LLE without any difficulty.  No calf tenderness  palpation.  Skin:    General: Skin is warm and dry.     Capillary Refill: Capillary refill takes less than 2 seconds.     Comments: He has a 4 x 3 cm area of erythema, warmth with a central scabbed region.  This area is tender to palpation.  No fluctuance.  No active drainage.  There is some surrounding warmth, induration.  Neurological:     Mental Status: He is alert and oriented to person, place, and time.  Psychiatric:        Speech: Speech normal.        ED Results / Procedures / Treatments   Labs (all labs ordered are listed, but only abnormal results are displayed) Labs Reviewed - No data to display  EKG None  Radiology No results found.  Procedures Procedures (including critical care time)  Medications Ordered in ED Medications  doxycycline (VIBRA-TABS) tablet 100 mg (100 mg Oral Given 08/14/20 1819)  acetaminophen (TYLENOL) tablet 650 mg (650 mg Oral Given 08/14/20 1820)  Tdap (BOOSTRIX) injection 0.5 mL (0.5 mLs Intramuscular Given 08/14/20 1857)    ED Course  I have reviewed the triage vital signs and the nursing notes.  Pertinent labs & imaging results that were available during my care of the patient were reviewed by me and considered in my medical decision making (see chart for details).    MDM Rules/Calculators/A&P                          59 year old male who presents for evaluation of left lower extremity pain, redness, swelling after possible insect bite about a week ago.  States it is become more painful, more red, prompting ED visit.  No fevers, nausea/vomiting.  Initially arrival, patient is afebrile, nontoxic-appearing.  He does have some pain with palpation of the leg but otherwise is overall well-appearing.  He is slightly tachycardic and hypertensive.  This is likely secondary to pain. No signs of systemic illness.  On exam, he has an area of redness, warmth consistent with insect bite with probable surrounding cellulitis noted to anterior aspect of  his left lower extremity.  Triage note does mention includes but when I asked patient, he did not actually visualize any spider biting him.  It does appear that he has an insect bite noted to the central area.  He also has surrounding warmth, erythema.  This is consistent with cellulitis.  No abscess that would require I&D here department.  History/physical exam concerning for septic throat is, ischemic limb, DVT.  We will plan for antibiotics for cellulitis.  Patient with  no known drug allergies.  Additionally, will give short course of pain medication for acute pain.  Patient's tetanus updated. At this time, patient exhibits no emergent life-threatening condition that require further evaluation in ED or admission. Patient had ample opportunity for questions and discussion. All patient's questions were answered with full understanding. Strict return precautions discussed. Patient expresses understanding and agreement to plan.   Portions of this note were generated with Scientist, clinical (histocompatibility and immunogenetics). Dictation errors may occur despite best attempts at proofreading.  Final Clinical Impression(s) / ED Diagnoses Final diagnoses:  Cellulitis of left lower extremity  Insect bite of left lower leg, initial encounter    Rx / DC Orders ED Discharge Orders         Ordered    doxycycline (VIBRAMYCIN) 100 MG capsule  2 times daily        08/14/20 1839    HYDROcodone-acetaminophen (NORCO/VICODIN) 5-325 MG tablet  Every 6 hours PRN        08/14/20 1839           Rosana Hoes 08/14/20 2011    Alvira Monday, MD 08/14/20 2134

## 2020-08-14 NOTE — Discharge Instructions (Signed)
Take antibiotics as directed. Please take all of your antibiotics until finished.  You can take Tylenol or Ibuprofen as directed for pain. You can alternate Tylenol and Ibuprofen every 4 hours. If you take Tylenol at 1pm, then you can take Ibuprofen at 5pm. Then you can take Tylenol again at 9pm.   Take pain medications as directed for break through pain. Do not drive or operate machinery while taking this medication.   Follow-up with [facility] to establish a primary care doctor if you do not have one.   Return the emergency department for any fever, nausea/vomiting, redness or swelling that begins to spread up the leg or any other worsening concerning symptoms.
